# Patient Record
Sex: Male | Born: 1965 | Race: White | Hispanic: No | Marital: Single | Smoking: Never smoker
Health system: Southern US, Community
[De-identification: ages and names within clinical notes are randomized; demographics above are authoritative.]

## PROBLEM LIST (undated history)

## (undated) DIAGNOSIS — E785 Hyperlipidemia, unspecified: Secondary | ICD-10-CM

## (undated) HISTORY — DX: Hyperlipidemia, unspecified: E78.5

---

## 2006-01-27 ENCOUNTER — Ambulatory Visit (HOSPITAL_BASED_OUTPATIENT_CLINIC_OR_DEPARTMENT_OTHER): Admission: RE | Admit: 2006-01-27 | Discharge: 2006-01-27 | Payer: Self-pay | Admitting: Plastic Surgery

## 2009-03-04 ENCOUNTER — Emergency Department: Payer: Self-pay | Admitting: Emergency Medicine

## 2009-04-16 ENCOUNTER — Ambulatory Visit: Payer: Self-pay | Admitting: Unknown Physician Specialty

## 2009-05-26 ENCOUNTER — Ambulatory Visit: Payer: Self-pay | Admitting: Gastroenterology

## 2009-10-17 ENCOUNTER — Ambulatory Visit: Payer: Self-pay | Admitting: Internal Medicine

## 2009-10-27 ENCOUNTER — Ambulatory Visit: Payer: Self-pay | Admitting: Internal Medicine

## 2009-11-16 ENCOUNTER — Ambulatory Visit: Payer: Self-pay | Admitting: Internal Medicine

## 2010-09-29 ENCOUNTER — Other Ambulatory Visit: Payer: Self-pay | Admitting: Dermatology

## 2011-10-06 ENCOUNTER — Other Ambulatory Visit: Payer: Self-pay | Admitting: Dermatology

## 2012-03-22 ENCOUNTER — Other Ambulatory Visit: Payer: Self-pay | Admitting: Dermatology

## 2012-10-12 ENCOUNTER — Other Ambulatory Visit: Payer: Self-pay | Admitting: Dermatology

## 2013-04-30 ENCOUNTER — Other Ambulatory Visit: Payer: Self-pay | Admitting: Dermatology

## 2013-11-03 DIAGNOSIS — D649 Anemia, unspecified: Secondary | ICD-10-CM | POA: Insufficient documentation

## 2013-12-15 DIAGNOSIS — F32A Depression, unspecified: Secondary | ICD-10-CM | POA: Insufficient documentation

## 2013-12-15 DIAGNOSIS — F329 Major depressive disorder, single episode, unspecified: Secondary | ICD-10-CM | POA: Insufficient documentation

## 2014-11-05 ENCOUNTER — Ambulatory Visit
Admit: 2014-11-05 | Disposition: A | Discharge status: Home / Self Care | Payer: Self-pay | Attending: Orthopedic Surgery | Admitting: Orthopedic Surgery

## 2014-11-17 NOTE — Op Note (Signed)
PATIENT NAME:  Dustin Norton, Dustin Norton MR#:  [id number] DATE OF BIRTH:  May 16, 1966  DATE OF PROCEDURE:  11/05/2014  PREOPERATIVE DIAGNOSIS:  Medial meniscus tear left knee.   POSTOPERATIVE DIAGNOSIS:  Bucket handle tear, displaced left knee medial meniscus.   PROCEDURE:  Arthroscopic partial medial meniscectomy.   ANESTHESIA:  General.   SURGEON:  Kennedy BuckerMichael Durwin Davisson. MD    DESCRIPTION OF PROCEDURE:  The patient was brought to the operating room and after adequate anesthesia was obtained, the left leg was placed in the arthroscopic leg holder as a tourniquet was not required.  After prepping and draping the leg in the usual sterile fashion and after arthroscopic equipment was set up, appropriate patient identification and timeout procedures were completed.  An inferolateral portal was made and on initial inspection, there was mild chondromalacia throughout the knee without any exposed bone or fissuring of the bone.  Patella appeared to track normally.  In the medial compartment, there was a displaced bucket-handle tear with the meniscus being torn from across the entire posterior meniscus to the junction of the middle and anterior thirds.  The meniscus was deformed and frayed.  Repair was not possible.  Arthroscopic scissors and punches were used along with a shaver to remove this large medial meniscus tear.  ArthroCare wand was used to smooth the edges.  The lateral meniscus was normal as was the ACL.  The gutters were free of any loose bodies.  After resecting the medial meniscus tear, all instrumentation was withdrawn.  The wounds were dressed with Xeroform, 4 x 4s, Webril and Ace wrap after infiltration of 20 mL of 0.5% Sensorcaine in the area of the portals.  The patient was sent to the recovery room in stable condition.   ESTIMATED BLOOD LOSS:  Minimal.   COMPLICATIONS:  None.   SPECIMEN:  None.   Pre- and postprocedure pictures obtained.    ____________________________ Leitha SchullerMichael J. Clydie Dillen,  MD mjm:[id number] D: 11/05/2014 20:30:49 ET T: 11/05/2014 22:11:57 ET JOB#: [id number]  cc: Leitha SchullerMichael J. Mohamud Mrozek, MD, <Dictator> Leitha SchullerMICHAEL J Emely Fahy MD ELECTRONICALLY SIGNED 11/05/2014 22:48

## 2015-01-21 ENCOUNTER — Ambulatory Visit: Payer: BLUE CROSS/BLUE SHIELD | Attending: Specialist

## 2015-01-21 DIAGNOSIS — G4761 Periodic limb movement disorder: Secondary | ICD-10-CM | POA: Diagnosis not present

## 2015-01-21 DIAGNOSIS — G4733 Obstructive sleep apnea (adult) (pediatric): Secondary | ICD-10-CM | POA: Diagnosis present

## 2016-12-22 DIAGNOSIS — C44619 Basal cell carcinoma of skin of left upper limb, including shoulder: Secondary | ICD-10-CM | POA: Diagnosis not present

## 2016-12-22 DIAGNOSIS — L570 Actinic keratosis: Secondary | ICD-10-CM | POA: Diagnosis not present

## 2016-12-22 DIAGNOSIS — Z86018 Personal history of other benign neoplasm: Secondary | ICD-10-CM | POA: Diagnosis not present

## 2016-12-22 DIAGNOSIS — L821 Other seborrheic keratosis: Secondary | ICD-10-CM | POA: Diagnosis not present

## 2016-12-22 DIAGNOSIS — D485 Neoplasm of uncertain behavior of skin: Secondary | ICD-10-CM | POA: Diagnosis not present

## 2016-12-22 DIAGNOSIS — D1801 Hemangioma of skin and subcutaneous tissue: Secondary | ICD-10-CM | POA: Diagnosis not present

## 2016-12-22 DIAGNOSIS — D225 Melanocytic nevi of trunk: Secondary | ICD-10-CM | POA: Diagnosis not present

## 2017-03-09 DIAGNOSIS — C44619 Basal cell carcinoma of skin of left upper limb, including shoulder: Secondary | ICD-10-CM | POA: Diagnosis not present

## 2017-04-18 DIAGNOSIS — Z0000 Encounter for general adult medical examination without abnormal findings: Secondary | ICD-10-CM | POA: Diagnosis not present

## 2017-04-18 DIAGNOSIS — Z125 Encounter for screening for malignant neoplasm of prostate: Secondary | ICD-10-CM | POA: Diagnosis not present

## 2017-04-18 DIAGNOSIS — R5383 Other fatigue: Secondary | ICD-10-CM | POA: Diagnosis not present

## 2017-04-18 DIAGNOSIS — Z23 Encounter for immunization: Secondary | ICD-10-CM | POA: Diagnosis not present

## 2017-04-28 DIAGNOSIS — H52222 Regular astigmatism, left eye: Secondary | ICD-10-CM | POA: Diagnosis not present

## 2017-04-28 DIAGNOSIS — H524 Presbyopia: Secondary | ICD-10-CM | POA: Diagnosis not present

## 2017-04-28 DIAGNOSIS — H5213 Myopia, bilateral: Secondary | ICD-10-CM | POA: Diagnosis not present

## 2017-07-05 DIAGNOSIS — J019 Acute sinusitis, unspecified: Secondary | ICD-10-CM | POA: Diagnosis not present

## 2017-07-22 DIAGNOSIS — Z1211 Encounter for screening for malignant neoplasm of colon: Secondary | ICD-10-CM | POA: Diagnosis not present

## 2017-07-22 DIAGNOSIS — Z8371 Family history of colonic polyps: Secondary | ICD-10-CM | POA: Diagnosis not present

## 2017-07-22 DIAGNOSIS — Z01818 Encounter for other preprocedural examination: Secondary | ICD-10-CM | POA: Diagnosis not present

## 2017-07-26 DIAGNOSIS — F4321 Adjustment disorder with depressed mood: Secondary | ICD-10-CM | POA: Diagnosis not present

## 2017-07-29 DIAGNOSIS — H1045 Other chronic allergic conjunctivitis: Secondary | ICD-10-CM | POA: Diagnosis not present

## 2017-07-29 DIAGNOSIS — J309 Allergic rhinitis, unspecified: Secondary | ICD-10-CM | POA: Diagnosis not present

## 2017-08-30 DIAGNOSIS — F4321 Adjustment disorder with depressed mood: Secondary | ICD-10-CM | POA: Diagnosis not present

## 2017-09-02 ENCOUNTER — Emergency Department: Payer: BLUE CROSS/BLUE SHIELD

## 2017-09-02 ENCOUNTER — Other Ambulatory Visit: Payer: Self-pay

## 2017-09-02 ENCOUNTER — Encounter: Payer: Self-pay | Admitting: Emergency Medicine

## 2017-09-02 ENCOUNTER — Emergency Department
Admission: EM | Admit: 2017-09-02 | Discharge: 2017-09-02 | Disposition: A | Discharge status: Home / Self Care | Payer: BLUE CROSS/BLUE SHIELD | Attending: Emergency Medicine | Admitting: Emergency Medicine

## 2017-09-02 DIAGNOSIS — R079 Chest pain, unspecified: Secondary | ICD-10-CM

## 2017-09-02 DIAGNOSIS — R42 Dizziness and giddiness: Secondary | ICD-10-CM | POA: Insufficient documentation

## 2017-09-02 DIAGNOSIS — R0602 Shortness of breath: Secondary | ICD-10-CM | POA: Diagnosis not present

## 2017-09-02 LAB — BASIC METABOLIC PANEL
Anion gap: 8 (ref 5–15)
BUN: 15 mg/dL (ref 6–20)
CALCIUM: 9.1 mg/dL (ref 8.9–10.3)
CO2: 25 mmol/L (ref 22–32)
CREATININE: 0.79 mg/dL (ref 0.61–1.24)
Chloride: 106 mmol/L (ref 101–111)
GFR calc Af Amer: >60 mL/min (ref >60)
GFR calc non Af Amer: >60 mL/min (ref >60)
Glucose, Bld: 91 mg/dL (ref 65–99)
Potassium: 3.9 mmol/L (ref 3.5–5.1)
SODIUM: 139 mmol/L (ref 135–145)

## 2017-09-02 LAB — CBC
HCT: 42.9 % (ref 40.0–52.0)
Hemoglobin: 14.8 g/dL (ref 13.0–18.0)
MCH: 29.1 pg (ref 26.0–34.0)
MCHC: 34.4 g/dL (ref 32.0–36.0)
MCV: 84.6 fL (ref 80.0–100.0)
Platelets: 258 10*3/uL (ref 150–440)
RBC: 5.08 MIL/uL (ref 4.40–5.90)
RDW: 13.8 % (ref 11.5–14.5)
WBC: 7.8 10*3/uL (ref 3.8–10.6)

## 2017-09-02 LAB — TROPONIN I: Troponin I: <0.03 ng/mL (ref <0.03)

## 2017-09-02 MED ORDER — ASPIRIN 81 MG PO CHEW
324.0000 mg | CHEWABLE_TABLET | Freq: Once | ORAL | Status: AC
  Administered 2017-09-02: 324 mg via ORAL
  Filled 2017-09-02: qty 4

## 2017-09-02 MED ORDER — NITROGLYCERIN 0.4 MG SL SUBL
0.4000 mg | SUBLINGUAL_TABLET | SUBLINGUAL | Status: DC | PRN
  Administered 2017-09-02: 0.4 mg via SUBLINGUAL
  Filled 2017-09-02: qty 1

## 2017-09-02 MED ORDER — SODIUM CHLORIDE 0.9 % IV BOLUS (SEPSIS)
1000.0000 mL | Freq: Once | INTRAVENOUS | Status: AC
  Administered 2017-09-02: 1000 mL via INTRAVENOUS

## 2017-09-02 NOTE — ED Provider Notes (Signed)
[facility] Emergency Department Provider Note  ____________________________________________  Time seen: Approximately 2:27 PM  I have reviewed the triage vital signs and the nursing notes.   HISTORY  Chief Complaint Chest Pain    HPI Dustin Norton is a 52 y.o. male otherwise healthy, presenting with chest pain.  The patient reports that 3 months ago, he had an episode of chest pain which woke him up in the middle the night.  It was a severe tightness on the left side of the chest that radiated to the left shoulder and upper arm and resolve spontaneously within 2-3 minutes.  2 weeks ago, he had a similar episode.  He attributed both of these episodes to eating large meals prior to going to bed.  Over the last 4 days, the patient has had a constant 2-3/10 left-sided chest pain which feels like "bubbles" that is associated with shortness of breath occasionally when he ambulates.  He has no associated diaphoresis, palpitations, lightheadedness or syncope.  The pain is not related to food.  He states the pain is better when he lays down.  The patient has had recent travel to [place].  He is a non-smoker, and has no personal or family history of blood clots; no lower extremity swelling or calf pain.  Patient reports his last stress test was greater than 10 years ago.  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  FH: Mother currently has cardiac disease in her 67s; maternal grandfather with massive MI at age 81.  History reviewed. No pertinent past medical history.  There are no active problems to display for this patient.   History reviewed. No pertinent surgical history.    Allergies Patient has no known allergies.  No family history on file.  Social History Social History   Tobacco Use  . Smoking status: Never Smoker  . Smokeless tobacco: Never Used  Substance Use Topics  . Alcohol use: Yes    Frequency: Never  . Drug use: No     Review of Systems Constitutional: No fever/chills.  No lightheadedness or syncope. Eyes: No visual changes. ENT: No sore throat. No congestion or rhinorrhea. Cardiovascular: Positive left-sided chest pain. Denies palpitations. Respiratory: Positive exertional shortness of breath.  No cough. Gastrointestinal: No abdominal pain.  No nausea, no vomiting.  No diarrhea.  No constipation. Genitourinary: Negative for dysuria. Musculoskeletal: Negative for back pain.  No lower extremity swelling or calf pain. Skin: Negative for rash. Neurological: Negative for headaches. No focal numbness, tingling or weakness.     ____________________________________________   PHYSICAL EXAM:  VITAL SIGNS: ED Triage Vitals  Enc Vitals Group     BP 09/02/17 0954 (!) 116/92     Pulse Rate 09/02/17 0954 89     Resp 09/02/17 0954 16     Temp 09/02/17 0954 98.2 F (36.8 C)     Temp Source 09/02/17 0954 Oral     SpO2 09/02/17 0954 100 %     Weight 09/02/17 0955 185 lb (83.9 kg)     Height 09/02/17 0955 6' (1.829 m)     Head Circumference --      Peak Flow --      Pain Score 09/02/17 0954 3     Pain Loc --      Pain Edu? --      Excl. in GC? --     Constitutional: Alert and oriented. Well appearing and in no acute distress. Answers questions appropriately. Eyes: Conjunctivae are  normal.  EOMI. No scleral icterus. Head: Atraumatic. Nose: No congestion/rhinnorhea. Mouth/Throat: Mucous membranes are moist.  Neck: No stridor.  Supple.  No JVD.  No meningismus. Cardiovascular: Normal rate, regular rhythm. No murmurs, rubs or gallops.  There is a 2 x 2 centimeter area inferior lateral to the left nipple which has a reproducible area of pain without any overlying skin changes, crepitus or palpable rib instability. Respiratory: Normal respiratory effort.  No accessory muscle use or retractions. Lungs CTAB.  No wheezes, rales or ronchi. Gastrointestinal: Soft, nontender and nondistended.  No guarding or  rebound.  No peritoneal signs. Musculoskeletal: No LE edema. No ttp in the calves or palpable cords.  Negative Homan's sign. Neurologic:  A&Ox3.  Speech is clear.  Face and smile are symmetric.  EOMI.  Moves all extremities well. Skin:  Skin is warm, dry and intact. No rash noted. Psychiatric: Mood and affect are normal. Speech and behavior are normal.  Normal judgement  ____________________________________________   LABS (all labs ordered are listed, but only abnormal results are displayed)  Labs Reviewed  BASIC METABOLIC PANEL  CBC  TROPONIN I  TROPONIN I   ____________________________________________  EKG  ED ECG REPORT I, Rockne MenghiniNorman, Anne-Caroline, the attending physician, personally viewed and interpreted this ECG.   Date: 09/02/2017  EKG Time: 953  Rate: 93  Rhythm: normal sinus rhythm  Axis: normal  Intervals:none  ST&T Change: No STEMI  ____________________________________________  RADIOLOGY  Dg Chest 2 View  Result Date: 09/02/2017 CLINICAL DATA:  Chest pain and shortness of breath EXAM: CHEST  2 VIEW COMPARISON:  None. FINDINGS: Lungs are clear. Heart size and pulmonary vascularity are normal. No adenopathy. No pneumothorax. No bone lesions. IMPRESSION: No edema or consolidation. Electronically Signed   By: Bretta BangWilliam  Woodruff III M.D.   On: 09/02/2017 10:18    ____________________________________________   PROCEDURES  Procedure(s) performed: None  Procedures  Critical Care performed: No ____________________________________________   INITIAL IMPRESSION / ASSESSMENT AND PLAN / ED COURSE  Pertinent labs & imaging results that were available during my care of the patient were reviewed by me and considered in my medical decision making (see chart for details).  52 y.o. male whose risk factor for cardiac disease his family history only presenting with atypical chest pain.  Overall, the patient is hemodynamically stable.  His EKG does not show ischemic  changes.  His troponin is negative and his chest x-ray does not show any acute cardiopulmonary process.  His blood counts are within normal limits and his electrolytes are also normal.  It is possible that the patient has cardiac pain, but at this time I do not see any evidence of ACS or MI.  It is very unlikely that he has PE as he has a normal heart rate, normal oxygenation, non-pleuritic pain, no evidence of DVT; he does not have the risk factors of travel or smoking, or family history.  Aortic pathology is also unlikely.  GI cause including esophageal spasm, peptic ulcer disease, reflux are possible, especially given the bubbling sensation although would not expect him to have improvement in symptoms with lying down and he does not correlate his recent chest pain to food.  I will plan to treat the patient with sublingual nitroglycerin, aspirin, and get a second troponin.  If he continues to have a reassuring evaluation in the emerge neurologist about an outpatient risk stratification study.  Plan reevaluation for final disposition.  ----------------------------------------- 3:46 PM on 09/02/2017 -----------------------------------------  The patient states that his  pain slightly improved with nitroglycerin, but he did get hypotensive to the 80s.  His hypotension resolved with reverse Trendelenburg positioning as well as a liter of fluid.  He states that when he laid backwards, has pain was slightly worse today.  The patient's cardiac workup continues to be reassuring with a second negative troponin and he continues to be hemodynamically stable.  I spoke with Dr. Kirke Corin, the cardiologist on call, who will set him up for a stress test and call him for outpatient evaluation.  I also encouraged the patient to make an appoint with Dr. Maximino Greenland who is the GI physician on-call as well as his primary care physician.  At this time, the patient is safe for discharge.  He understands return precautions as well as  follow-up instructions per ____________________________________________  FINAL CLINICAL IMPRESSION(S) / ED DIAGNOSES  Final diagnoses:  Chest pain, unspecified type  Shortness of breath  Lightheadedness         NEW MEDICATIONS STARTED DURING THIS VISIT:  New Prescriptions   No medications on file      Rockne Menghini, MD 09/02/17 1547

## 2017-09-02 NOTE — ED Triage Notes (Signed)
Pt to ed with c/o cp that started about 1 week ago, intermittently.  Pt states sob and dizziness associated with chest pain.  Pt states pain comes at varying times during the day.  Pt denies diaphoresis.

## 2017-09-02 NOTE — Discharge Instructions (Signed)
Dr. Kirke Norton will set you up for a stress test; please call to make an appointment.  Please also call the GI physicians for a follow-up appointment.  Return to the emergency department if you develop worsening pain, shortness of breath, lightheadedness or fainting, palpitations, or any other symptoms concerning to you.

## 2017-09-06 ENCOUNTER — Telehealth: Payer: Self-pay | Admitting: Cardiovascular Disease

## 2017-09-06 DIAGNOSIS — R0789 Other chest pain: Secondary | ICD-10-CM

## 2017-09-06 NOTE — Telephone Encounter (Signed)
Iran OuchArida, Muhammad A, MD  Shon BatonYow, Alexx Mcburney H, RN        This patient is referred from the ED for chest pain. Schedule him for GXT next week and follow-up with me after.

## 2017-09-06 NOTE — Telephone Encounter (Signed)
Order place for treadmill to be done next week. Will forward to Support Pool to call patient to scheduled his GXT and follow up with Dr.Arida after.

## 2017-09-06 NOTE — Telephone Encounter (Signed)
lmov to schedule appt   Available next week : 2/27 at 9 am nurse for triage and End is provider in Clinic

## 2017-09-07 DIAGNOSIS — D485 Neoplasm of uncertain behavior of skin: Secondary | ICD-10-CM | POA: Diagnosis not present

## 2017-09-07 NOTE — Telephone Encounter (Signed)
No answer. Left message to call back to schedule the appointments. Routing back to support pool as well.

## 2017-09-12 NOTE — Telephone Encounter (Signed)
Lmov to schedule appointments

## 2017-09-14 ENCOUNTER — Encounter: Payer: Self-pay | Admitting: Cardiovascular Disease

## 2017-09-14 NOTE — Telephone Encounter (Signed)
Sent letter

## 2017-09-22 NOTE — Telephone Encounter (Signed)
Scheduled 3/20 gxt

## 2017-10-04 ENCOUNTER — Telehealth: Payer: Self-pay | Admitting: Cardiovascular Disease

## 2017-10-04 ENCOUNTER — Encounter (INDEPENDENT_AMBULATORY_CARE_PROVIDER_SITE_OTHER): Payer: Self-pay

## 2017-10-04 ENCOUNTER — Ambulatory Visit: Payer: BLUE CROSS/BLUE SHIELD | Admitting: Gastroenterology

## 2017-10-04 NOTE — Telephone Encounter (Signed)
Reminder call to patient regarding 3/20 GXT. Left message on machine for patient to contact the office.

## 2017-10-04 NOTE — Telephone Encounter (Signed)
Reviewed GXT instructions with patient who verbalized understanding.

## 2017-10-05 ENCOUNTER — Ambulatory Visit (INDEPENDENT_AMBULATORY_CARE_PROVIDER_SITE_OTHER): Payer: BLUE CROSS/BLUE SHIELD

## 2017-10-05 DIAGNOSIS — R0789 Other chest pain: Secondary | ICD-10-CM | POA: Diagnosis not present

## 2017-10-07 LAB — EXERCISE TOLERANCE TEST
CHL CUP MPHR: 171 {beats}/min
CSEPED: 8 min
CSEPEDS: 37 s
CSEPEW: 10.1 METS
CSEPHR: 88 %
CSEPPHR: 151 {beats}/min
RPE: 16
Rest HR: 90 {beats}/min

## 2017-10-13 ENCOUNTER — Ambulatory Visit (INDEPENDENT_AMBULATORY_CARE_PROVIDER_SITE_OTHER): Payer: BLUE CROSS/BLUE SHIELD | Admitting: Cardiovascular Disease

## 2017-10-13 ENCOUNTER — Encounter: Payer: Self-pay | Admitting: Cardiovascular Disease

## 2017-10-13 VITALS — BP 122/80 | HR 76 | Ht 72.0 in | Wt 189.0 lb

## 2017-10-13 DIAGNOSIS — R0789 Other chest pain: Secondary | ICD-10-CM | POA: Diagnosis not present

## 2017-10-13 DIAGNOSIS — R0602 Shortness of breath: Secondary | ICD-10-CM

## 2017-10-13 DIAGNOSIS — E785 Hyperlipidemia, unspecified: Secondary | ICD-10-CM | POA: Diagnosis not present

## 2017-10-13 NOTE — Progress Notes (Signed)
Cardiology Office Note   Date:  10/13/2017   ID:  Dustin Norton, DOB 12-Dec-1965, MRN [id number]  PCP:  Lauro Regulus, MD  Cardiologist:   Lorine Bears, MD   Chief Complaint  Patient presents with  . Other    seen in ED 09/02/2017 for chest pain. Patient c/o chest discomfort and mild SOB. Meds reviewed verbally with patient.       History of Present Illness: Dustin Norton is a 52 y.o. male who was referred from emergency room at [facility] for evaluation of chest pain.  He has no prior cardiac history. He has no significant chronic medical conditions other than depression and hyperlipidemia.  He has family history of coronary artery disease.  He is not a smoker. Over the last few months, he has experienced intermittent episodes of chest pain.  The chest pain is substernal described as sharp and aching as well as tightness feeling that typically happens at rest and not with physical activities.  The most intense episode woke him up from sleep at 2:00 in the morning.  He has significant heartburn and gas.  He tries to exercise regularly with no exertional chest pain.  He did notice worsening exertional dyspnea.  He went to the emergency room in February for these episodes.  Basic workup was negative including troponin and chest x-ray.  He reports increased stress at work recently he works in Catering manager and travels frequently. He underwent a treadmill stress test in our office which showed no evidence of ischemia.  He had no chest pain with exercise and was able to exercise for 8-1/2 minutes.   Past Medical History:  Diagnosis Date  . Hyperlipidemia     History reviewed. No pertinent surgical history.   Current Outpatient Medications  Medication Sig Dispense Refill  . Cholecalciferol (VITAMIN D-1000 MAX ST) 1000 units tablet Take by mouth.    . DOCOSAHEXAENOIC ACID PO Take by mouth.    . DYMISTA 137-50 MCG/ACT SUSP SPRAY 1 SPRAY INTO EACH NOSTRIL TWICE A DAY  3  .  levocetirizine (XYZAL) 5 MG tablet every evening.  3  . montelukast (SINGULAIR) 10 MG tablet Take 10 mg by mouth daily.  1  . Polyethylene Glycol 3350 (PEG 3350) POWD Take by mouth.    . vitamin B-12 (CYANOCOBALAMIN) 1000 MCG tablet Take by mouth.     No current facility-administered medications for this visit.     Allergies:   Patient has no known allergies.    Social History:  The patient  reports that he has never smoked. He has never used smokeless tobacco. He reports that he drinks alcohol. He reports that he does not use drugs.   Family History:  The patient's family history includes Heart attack in his maternal grandmother and mother; Stroke in his father.    ROS:  Please see the history of present illness.   Otherwise, review of systems are positive for none.   All other systems are reviewed and negative.    PHYSICAL EXAM: VS:  BP 122/80 (BP Location: Right Arm, Patient Position: Sitting, Cuff Size: Normal)   Pulse 76   Ht 6' (1.829 m)   Wt 189 lb (85.7 kg)   BMI 25.63 kg/m  , BMI Body mass index is 25.63 kg/m. GEN: Well nourished, well developed, in no acute distress  HEENT: normal  Neck: no JVD, carotid bruits, or masses Cardiac: RRR; no murmurs, rubs, or gallops,no edema  Respiratory:  clear to auscultation bilaterally,  normal work of breathing GI: soft, nontender, nondistended, + BS MS: no deformity or atrophy  Skin: warm and dry, no rash Neuro:  Strength and sensation are intact Psych: euthymic mood, full affect   EKG:  EKG is ordered today. The ekg ordered today demonstrates normal sinus rhythm with possible left atrial enlargement.  Nonspecific ST changes.   Recent Labs: 09/02/2017: BUN 15; Creatinine, Ser 0.79; Hemoglobin 14.8; Platelets 258; Potassium 3.9; Sodium 139    Lipid Panel No results found for: CHOL, TRIG, HDL, CHOLHDL, VLDL, LDLCALC, LDLDIRECT    Wt Readings from Last 3 Encounters:  10/13/17 189 lb (85.7 kg)  09/02/17 185 lb (83.9 kg)          PAD Screen 10/13/2017  Previous PAD dx? No  Previous surgical procedure? No  Pain with walking? No  Feet/toe relief with dangling? No  Painful, non-healing ulcers? No  Extremities discolored? No      ASSESSMENT AND PLAN:  1.  Atypical nonexertional chest pain likely GI in nature.  The patient underwent a treadmill stress test that showed no evidence of ischemia.  I agree with further GI workup including EGD. Given the patient reports that shortness of breath and borderline abnormal EKG, I requested an echocardiogram to ensure no structural heart abnormalities. The patient can undergo GI procedures at an overall low risk from a cardiac standpoint.  2.  Hyperlipidemia: Most recent lipid profile showed an LDL of 140, given the patient's family history of coronary artery disease I suggested risk stratification with coronary calcium score.    Disposition:   FU with me as needed.   Signed,  Lorine BearsMuhammad , MD  10/13/2017 1:23 PM    [facility]

## 2017-10-13 NOTE — Patient Instructions (Addendum)
Medication Instructions:  Your physician recommends that you continue on your current medications as directed. Please refer to the Current Medication list given to you today.   Labwork: none  Testing/Procedures: Your physician has requested that you have an echocardiogram. Echocardiography is a painless test that uses sound waves to create images of your heart. It provides your doctor with information about the size and shape of your heart and how well your heart's chambers and valves are working. This procedure takes approximately one hour. There are no restrictions for this procedure.  CT Calcium Score [facility] [address]  [phone]  $150 charge payable at the time of the test.    Follow-Up: Your physician recommends that you schedule a follow-up appointment as needed.    Any Other Special Instructions Will Be Listed Below (If Applicable).     If you need a refill on your cardiac medications before your next appointment, please call your pharmacy.  Echocardiogram An echocardiogram, or echocardiography, uses sound waves (ultrasound) to produce an image of your heart. The echocardiogram is simple, painless, obtained within a short period of time, and offers valuable information to your health care provider. The images from an echocardiogram can provide information such as:  Evidence of coronary artery disease (CAD).  Heart size.  Heart muscle function.  Heart valve function.  Aneurysm detection.  Evidence of a past heart attack.  Fluid buildup around the heart.  Heart muscle thickening.  Assess heart valve function.  Tell a health care provider about:  Any allergies you have.  All medicines you are taking, including vitamins, herbs, eye drops, creams, and over-the-counter medicines.  Any problems you or family members have had with anesthetic medicines.  Any blood disorders you have.  Any surgeries you have  had.  Any medical conditions you have.  Whether you are pregnant or may be pregnant. What happens before the procedure? No special preparation is needed. Eat and drink normally. What happens during the procedure?  In order to produce an image of your heart, gel will be applied to your chest and a wand-like tool (transducer) will be moved over your chest. The gel will help transmit the sound waves from the transducer. The sound waves will harmlessly bounce off your heart to allow the heart images to be captured in real-time motion. These images will then be recorded.  You may need an IV to receive a medicine that improves the quality of the pictures. What happens after the procedure? You may return to your normal schedule including diet, activities, and medicines, unless your health care provider tells you otherwise. This information is not intended to replace advice given to you by your health care provider. Make sure you discuss any questions you have with your health care provider. Document Released: 07/02/2000 Document Revised: 02/21/2016 Document Reviewed: 03/12/2013 Elsevier Interactive Patient Education  2017 [employer]

## 2017-10-18 ENCOUNTER — Telehealth: Payer: Self-pay | Admitting: *Deleted

## 2017-10-20 ENCOUNTER — Ambulatory Visit (INDEPENDENT_AMBULATORY_CARE_PROVIDER_SITE_OTHER): Payer: BLUE CROSS/BLUE SHIELD

## 2017-10-20 ENCOUNTER — Other Ambulatory Visit: Payer: Self-pay

## 2017-10-20 DIAGNOSIS — R0602 Shortness of breath: Secondary | ICD-10-CM

## 2017-10-21 ENCOUNTER — Telehealth: Payer: Self-pay | Admitting: *Deleted

## 2017-10-21 NOTE — Telephone Encounter (Signed)
Lpmtcb 4/5 md

## 2017-10-21 NOTE — Telephone Encounter (Signed)
Patient called and states he had an Echo done and he is calling to inquire about those results. Patent is requesting a call back . His call back # is [phone]. Please advise. Thank you

## 2017-10-27 ENCOUNTER — Encounter: Payer: Self-pay | Admitting: *Deleted

## 2017-10-27 NOTE — Telephone Encounter (Signed)
Patient returning call for results 

## 2017-10-27 NOTE — Telephone Encounter (Signed)
See results note. 

## 2017-11-03 ENCOUNTER — Other Ambulatory Visit: Payer: BLUE CROSS/BLUE SHIELD

## 2017-11-04 ENCOUNTER — Ambulatory Visit (INDEPENDENT_AMBULATORY_CARE_PROVIDER_SITE_OTHER)
Admission: RE | Admit: 2017-11-04 | Discharge: 2017-11-04 | Disposition: A | Discharge status: Home / Self Care | Payer: BLUE CROSS/BLUE SHIELD | Source: Ambulatory Visit | Attending: Cardiovascular Disease | Admitting: Cardiovascular Disease

## 2017-11-04 DIAGNOSIS — R0602 Shortness of breath: Secondary | ICD-10-CM

## 2017-11-09 DIAGNOSIS — K219 Gastro-esophageal reflux disease without esophagitis: Secondary | ICD-10-CM | POA: Diagnosis not present

## 2017-11-09 DIAGNOSIS — R109 Unspecified abdominal pain: Secondary | ICD-10-CM | POA: Diagnosis not present

## 2017-11-09 DIAGNOSIS — Z8371 Family history of colonic polyps: Secondary | ICD-10-CM | POA: Diagnosis not present

## 2017-11-09 DIAGNOSIS — Z1211 Encounter for screening for malignant neoplasm of colon: Secondary | ICD-10-CM | POA: Diagnosis not present

## 2017-11-22 DIAGNOSIS — H1045 Other chronic allergic conjunctivitis: Secondary | ICD-10-CM | POA: Diagnosis not present

## 2017-11-22 DIAGNOSIS — J300 Vasomotor rhinitis: Secondary | ICD-10-CM | POA: Diagnosis not present

## 2018-01-06 DIAGNOSIS — K640 First degree hemorrhoids: Secondary | ICD-10-CM | POA: Diagnosis not present

## 2018-01-06 DIAGNOSIS — K5730 Diverticulosis of large intestine without perforation or abscess without bleeding: Secondary | ICD-10-CM | POA: Diagnosis not present

## 2018-01-06 DIAGNOSIS — Z8371 Family history of colonic polyps: Secondary | ICD-10-CM | POA: Diagnosis not present

## 2018-01-06 DIAGNOSIS — K229 Disease of esophagus, unspecified: Secondary | ICD-10-CM | POA: Diagnosis not present

## 2018-01-06 DIAGNOSIS — Z1211 Encounter for screening for malignant neoplasm of colon: Secondary | ICD-10-CM | POA: Diagnosis not present

## 2018-01-06 DIAGNOSIS — K219 Gastro-esophageal reflux disease without esophagitis: Secondary | ICD-10-CM | POA: Diagnosis not present

## 2018-01-06 DIAGNOSIS — K3189 Other diseases of stomach and duodenum: Secondary | ICD-10-CM | POA: Diagnosis not present

## 2018-01-06 DIAGNOSIS — R0789 Other chest pain: Secondary | ICD-10-CM | POA: Diagnosis not present

## 2018-03-06 IMAGING — CR DG CHEST 2V
1 series · 2 of 2 positions shown · non-contrast
Comparison: None.

CLINICAL DATA: Chest pain and shortness of breath

EXAM:
CHEST  2 VIEW

[Series 1: dg chest 2 view · 0.14mm/px · 2 of 2 slices shown]
[im 1/2]
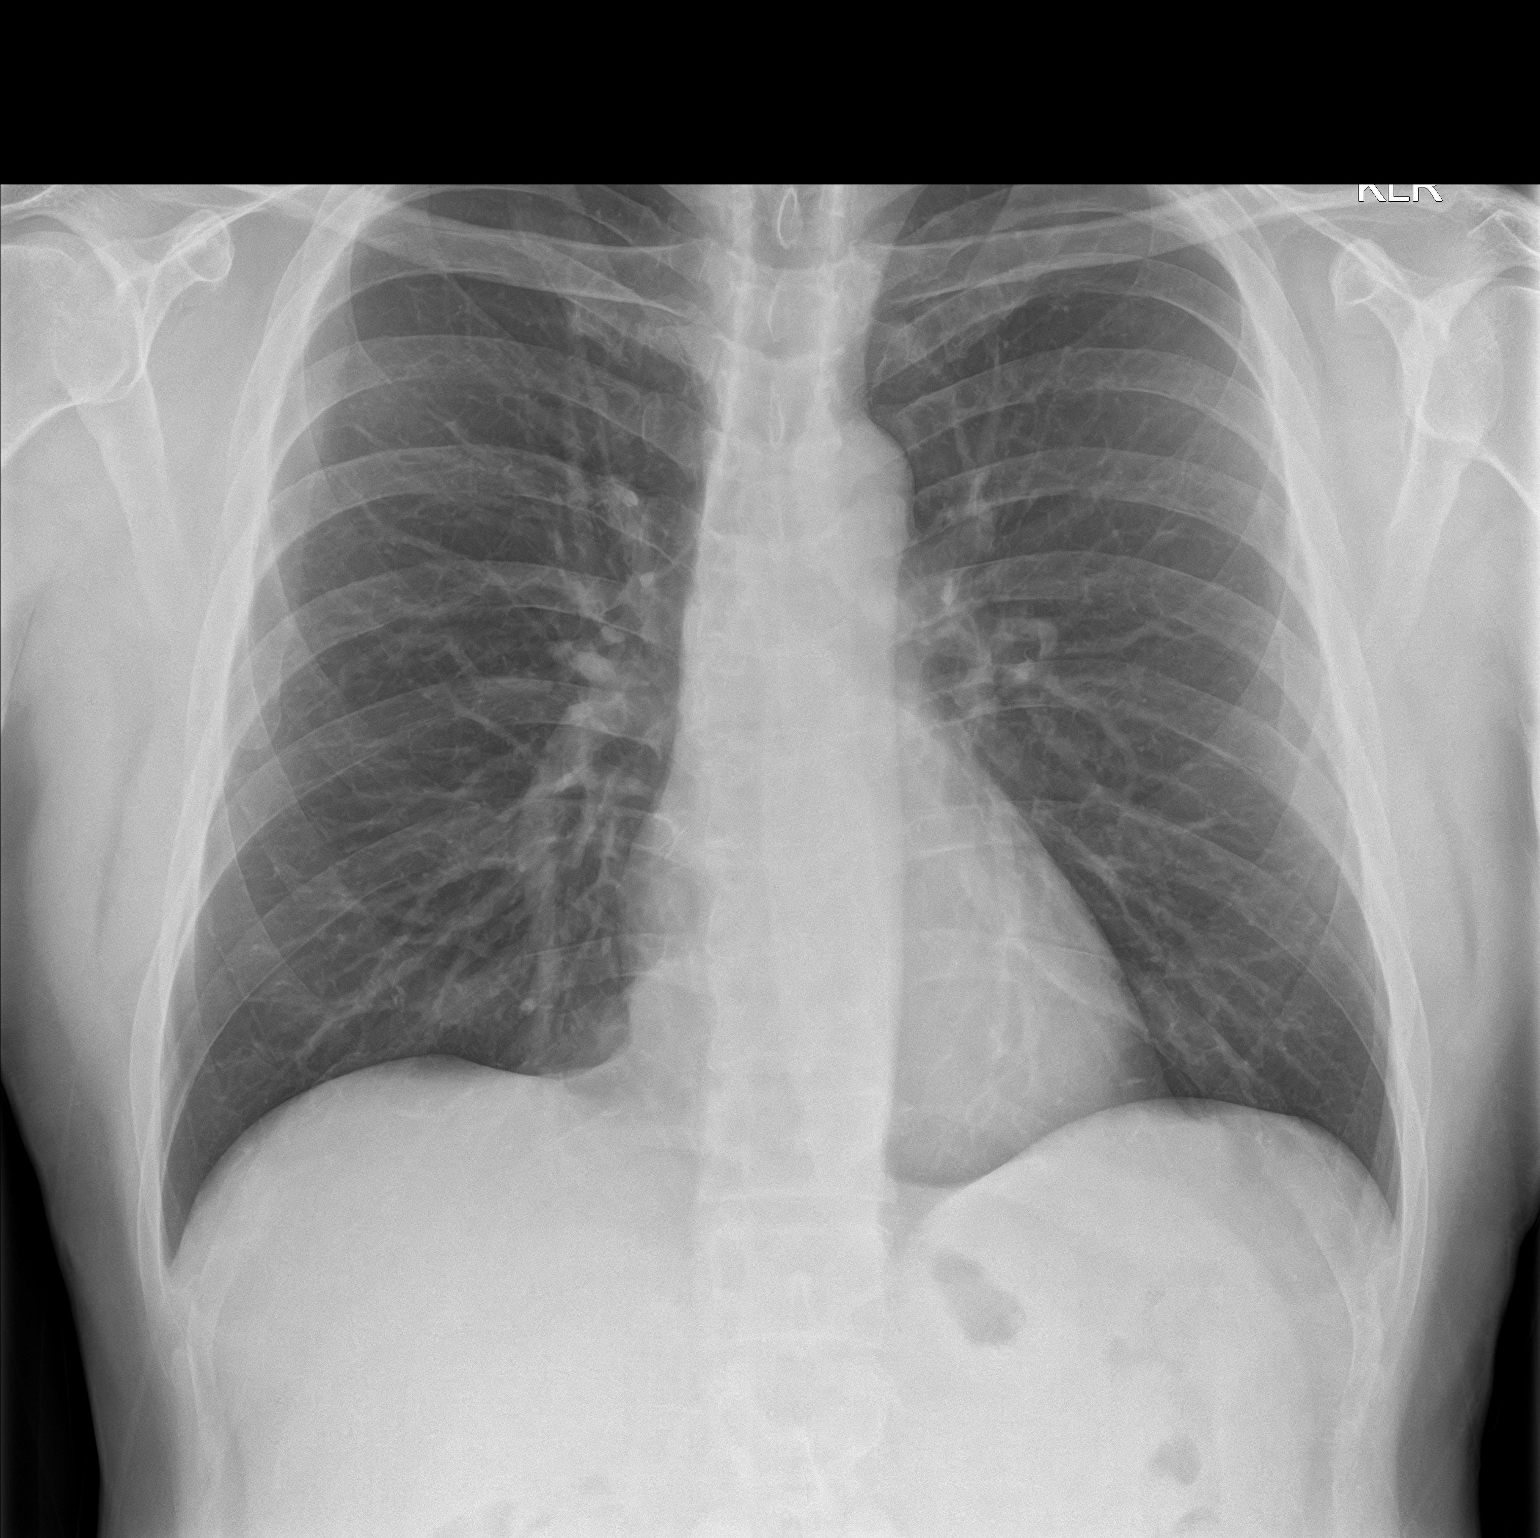
[im 2/2]
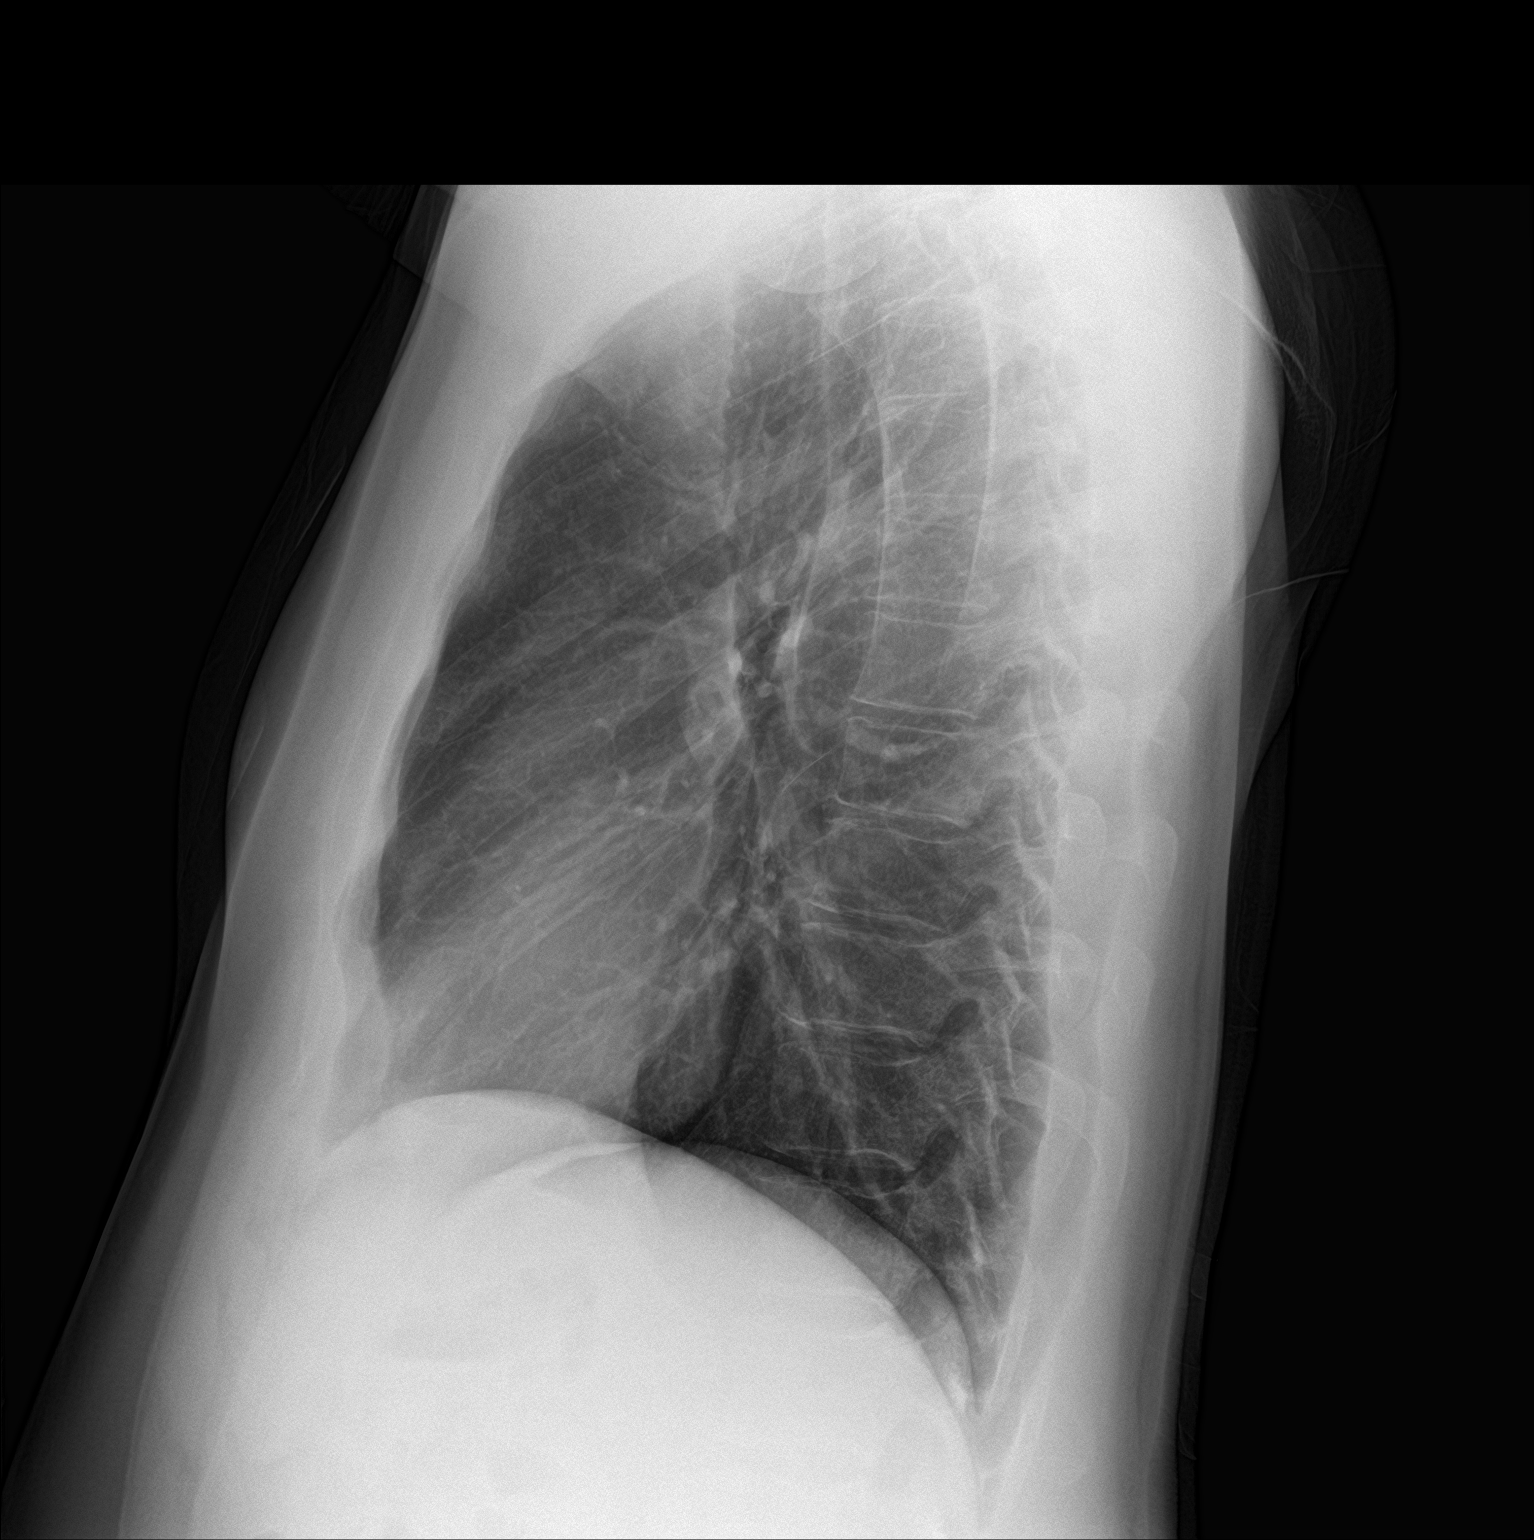

[2 of 2 positions shown; findings below may reference images not displayed]

FINDINGS: Lungs are clear. Heart size and pulmonary vascularity are normal. No
adenopathy. No pneumothorax. No bone lesions.
IMPRESSION: No edema or consolidation.

## 2018-03-08 DIAGNOSIS — L821 Other seborrheic keratosis: Secondary | ICD-10-CM | POA: Diagnosis not present

## 2018-03-08 DIAGNOSIS — C44612 Basal cell carcinoma of skin of right upper limb, including shoulder: Secondary | ICD-10-CM | POA: Diagnosis not present

## 2018-03-08 DIAGNOSIS — Z86018 Personal history of other benign neoplasm: Secondary | ICD-10-CM | POA: Diagnosis not present

## 2018-03-08 DIAGNOSIS — D485 Neoplasm of uncertain behavior of skin: Secondary | ICD-10-CM | POA: Diagnosis not present

## 2018-03-08 DIAGNOSIS — D225 Melanocytic nevi of trunk: Secondary | ICD-10-CM | POA: Diagnosis not present

## 2018-03-08 DIAGNOSIS — F419 Anxiety disorder, unspecified: Secondary | ICD-10-CM | POA: Diagnosis not present

## 2018-04-19 DIAGNOSIS — Z0000 Encounter for general adult medical examination without abnormal findings: Secondary | ICD-10-CM | POA: Diagnosis not present

## 2018-04-19 DIAGNOSIS — F329 Major depressive disorder, single episode, unspecified: Secondary | ICD-10-CM | POA: Diagnosis not present

## 2018-05-08 IMAGING — CT CT HEART SCORING
2 series · 16 of 20 positions shown, 18 images · non-contrast
Comparison: None.

CLINICAL DATA: Risk stratification

EXAM:
Coronary Calcium Score
TECHNIQUE: The patient was scanned on a Siemens Force scanner. Axial
non-contrast 3 mm slices were carried out through the heart. The
data set was analyzed on a dedicated work station and scored using
the Agatson method.

[Series 3: casc 3.0 i36f 2 bestdiast 66 % · axial · 0.34mm/px · z∈[-215,-107]mm · 8 of 48 slices shown, 10 images]
[im 6/48  vessel]
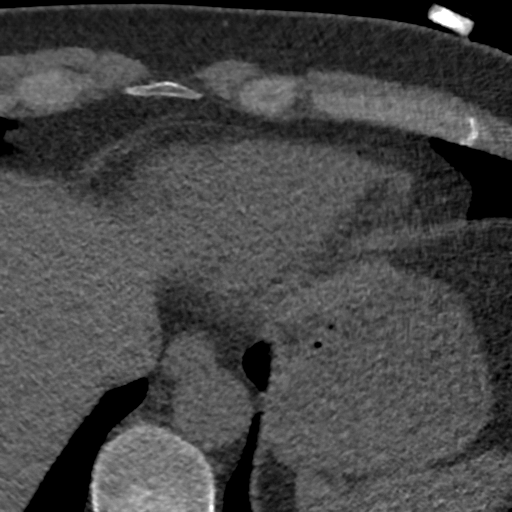
[im 6/48  lung]
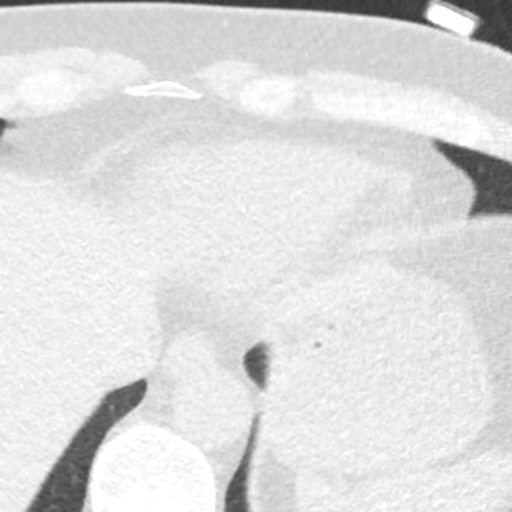
[im 11/48  vessel]
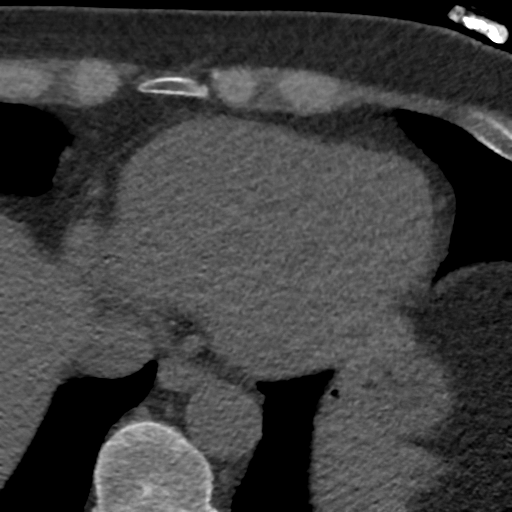
[im 16/48  vessel]
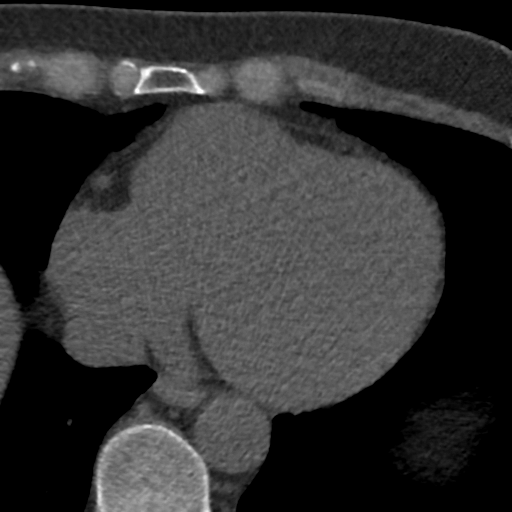
[im 21/48  vessel]
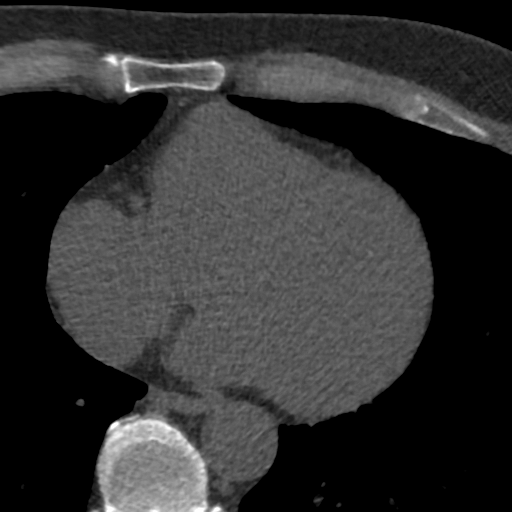
[im 27/48  vessel]
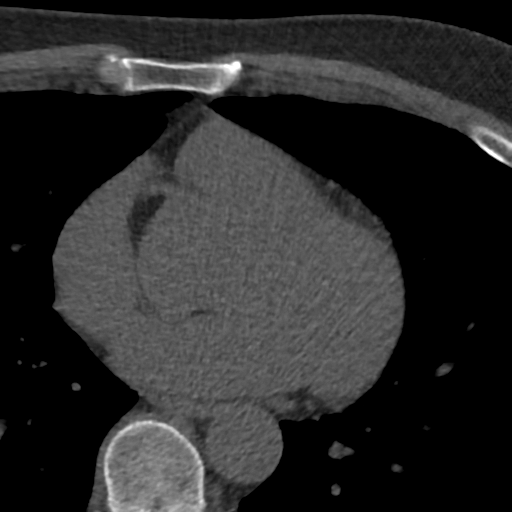
[im 27/48  lung]
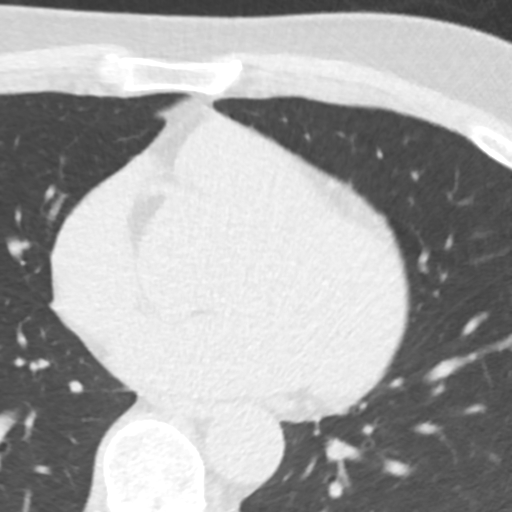
[im 32/48  vessel]
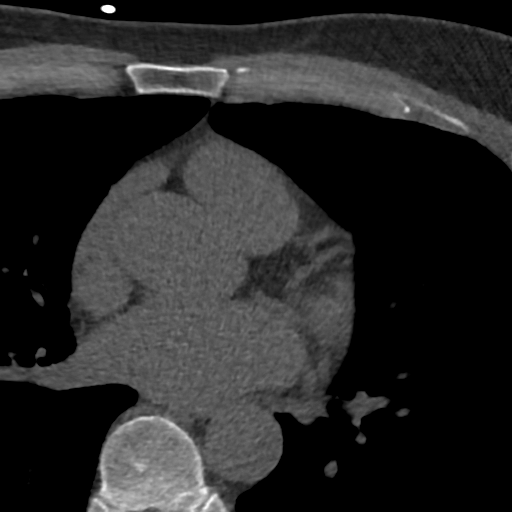
[im 37/48  vessel]
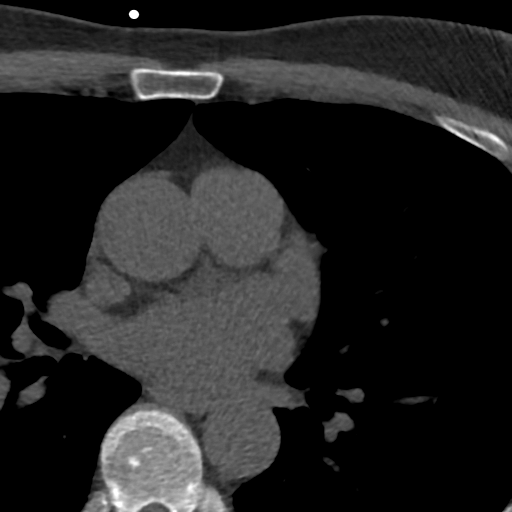
[im 42/48  vessel]
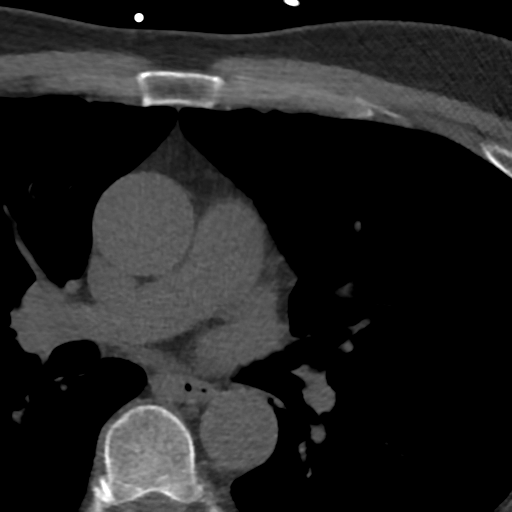

[Series 5: lung st 68 % · axial · 0.69mm/px · z∈[-215,-107]mm · 8 of 48 slices shown]
[im 6/48  lung]
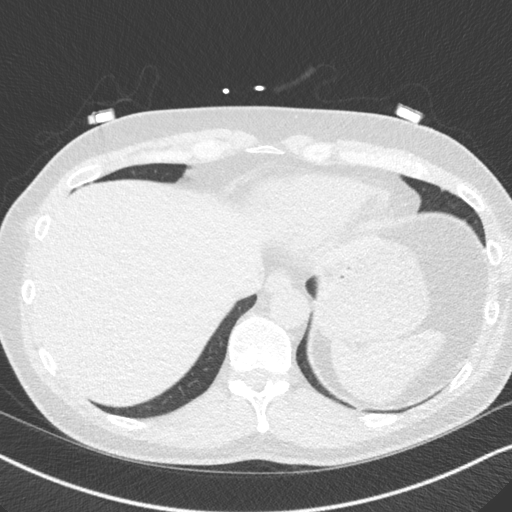
[im 11/48  lung]
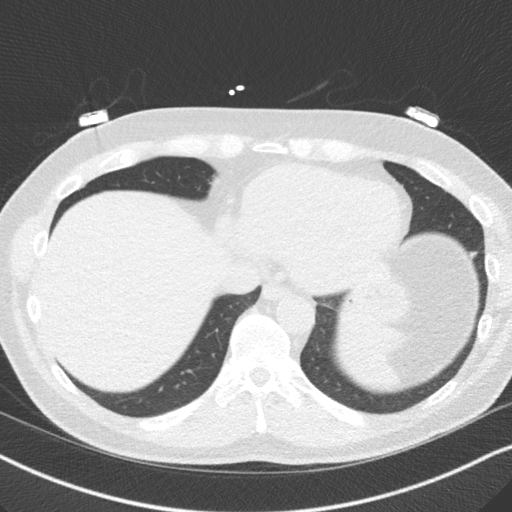
[im 16/48  lung]
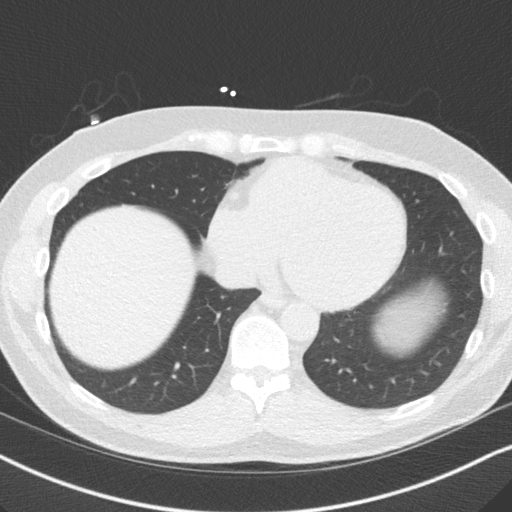
[im 21/48  lung]
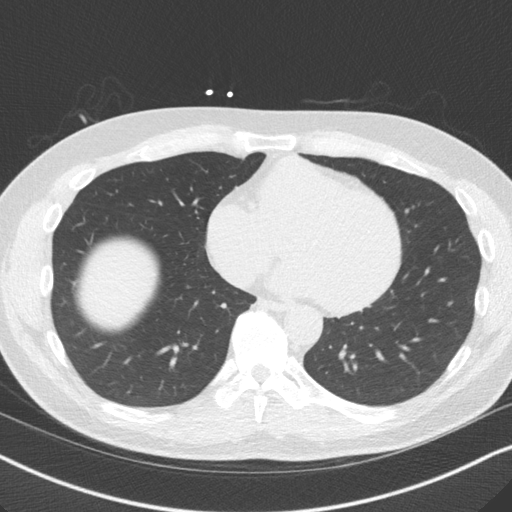
[im 27/48  lung]
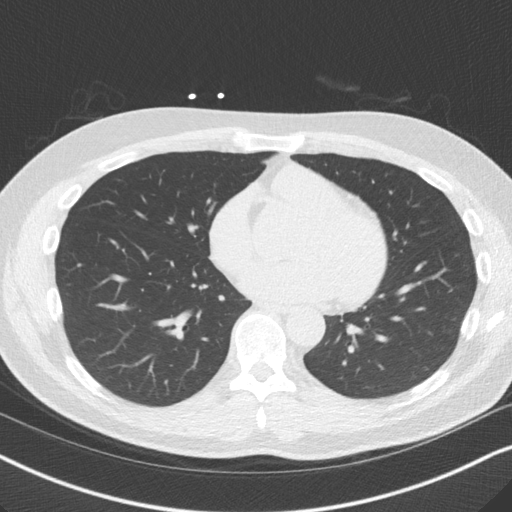
[im 32/48  lung]
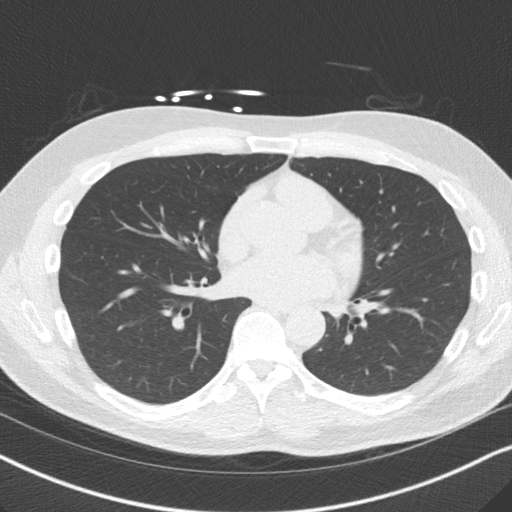
[im 37/48  lung]
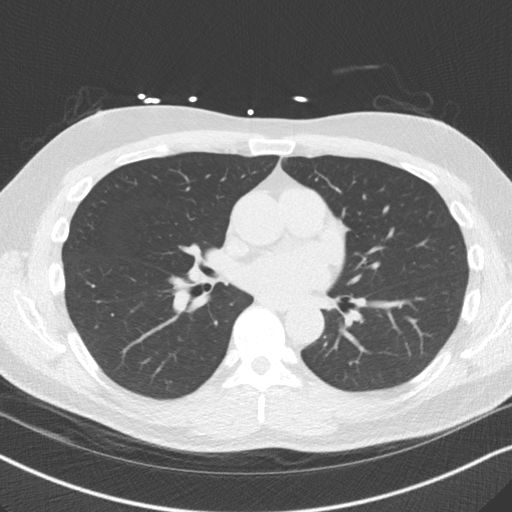
[im 42/48  lung]
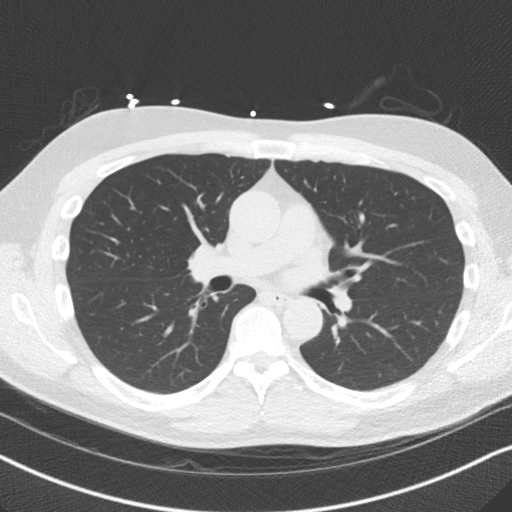

[16 of 20 positions shown; findings below may reference images not displayed]

FINDINGS: Non-cardiac: See separate report from [REDACTED].

Ascending Aorta: Normal size, no calcification

Pericardium: Normal.

Coronary arteries: Normal origin.
IMPRESSION: Coronary calcium score of 0. This was 0 percentile for age and sex
matched control.

EXAM:
OVER-READ INTERPRETATION  CT CHEST

The following report is an over-read performed by radiologist Dr.
Leonida Lz [REDACTED] on 11/04/2017. This over-read
does not include interpretation of cardiac or coronary anatomy or
pathology. The coronary calcium score interpretation by the
cardiologist is attached.
FINDINGS: Vascular: Heart is normal size.  Visualized aorta is normal caliber.

Mediastinum/Nodes: No adenopathy in the lower mediastinum or hila.

Lungs/Pleura: Visualized lungs clear.  No effusions.

Upper Abdomen: Imaging into the upper abdomen shows no acute
findings.

Musculoskeletal: Chest wall soft tissues are unremarkable. No acute
bony abnormality.
IMPRESSION: No acute or significant extracardiac abnormality.

## 2018-05-30 DIAGNOSIS — Z23 Encounter for immunization: Secondary | ICD-10-CM | POA: Diagnosis not present

## 2018-10-26 NOTE — Telephone Encounter (Signed)
Aware of appt
